# Patient Record
Sex: Female | Born: 2000 | Race: White | Hispanic: No | Marital: Single | Smoking: Never smoker
Health system: Southern US, Academic
[De-identification: ages and names within clinical notes are randomized; demographics above are authoritative.]

## PROBLEM LIST (undated history)

## (undated) DIAGNOSIS — Z973 Presence of spectacles and contact lenses: Secondary | ICD-10-CM

## (undated) DIAGNOSIS — N6489 Other specified disorders of breast: Secondary | ICD-10-CM

## (undated) DIAGNOSIS — M546 Pain in thoracic spine: Secondary | ICD-10-CM

## (undated) DIAGNOSIS — J4520 Mild intermittent asthma, uncomplicated: Secondary | ICD-10-CM

## (undated) DIAGNOSIS — E538 Deficiency of other specified B group vitamins: Secondary | ICD-10-CM

## (undated) DIAGNOSIS — J45909 Unspecified asthma, uncomplicated: Secondary | ICD-10-CM

## (undated) DIAGNOSIS — Z87898 Personal history of other specified conditions: Secondary | ICD-10-CM

## (undated) HISTORY — DX: Mild intermittent asthma, uncomplicated: J45.20

## (undated) HISTORY — DX: Other specified disorders of breast: N64.89

## (undated) HISTORY — DX: Deficiency of other specified B group vitamins: E53.8

## (undated) HISTORY — DX: Pain in thoracic spine: M54.6

## (undated) HISTORY — PX: HX TONSILLECTOMY: SHX27

## (undated) HISTORY — PX: HX WISDOM TEETH EXTRACTION: SHX21

---
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
## 2021-06-23 DIAGNOSIS — Y92410 Unspecified street and highway as the place of occurrence of the external cause: Secondary | ICD-10-CM | POA: Diagnosis not present

## 2021-06-23 DIAGNOSIS — S40811A Abrasion of right upper arm, initial encounter: Secondary | ICD-10-CM | POA: Diagnosis not present

## 2021-06-23 DIAGNOSIS — G44309 Post-traumatic headache, unspecified, not intractable: Secondary | ICD-10-CM | POA: Insufficient documentation

## 2021-06-23 DIAGNOSIS — S4991XA Unspecified injury of right shoulder and upper arm, initial encounter: Secondary | ICD-10-CM | POA: Diagnosis present

## 2021-06-24 ENCOUNTER — Emergency Department (HOSPITAL_COMMUNITY): Payer: Commercial Managed Care - PPO

## 2021-06-24 ENCOUNTER — Encounter (HOSPITAL_COMMUNITY): Payer: Self-pay | Admitting: Emergency Medicine

## 2021-06-24 ENCOUNTER — Other Ambulatory Visit: Payer: Self-pay

## 2021-06-24 ENCOUNTER — Emergency Department (HOSPITAL_COMMUNITY)
Admission: EM | Admit: 2021-06-24 | Discharge: 2021-06-24 | Disposition: A | Discharge status: Home / Self Care | Payer: Commercial Managed Care - PPO | Attending: Emergency Medicine | Admitting: Emergency Medicine

## 2021-06-24 DIAGNOSIS — G44319 Acute post-traumatic headache, not intractable: Secondary | ICD-10-CM

## 2021-06-24 MED ORDER — IBUPROFEN 800 MG PO TABS
800.0000 mg | ORAL_TABLET | Freq: Once | ORAL | Status: AC
  Administered 2021-06-24: 800 mg via ORAL
  Filled 2021-06-24: qty 1

## 2021-06-24 MED ORDER — NAPROXEN 500 MG PO TABS: 500.0000 mg | ORAL_TABLET | Freq: Two times a day (BID) | ORAL | 0 refills | Status: AC

## 2021-06-24 MED ORDER — METHOCARBAMOL 500 MG PO TABS: 500.0000 mg | ORAL_TABLET | Freq: Two times a day (BID) | ORAL | 0 refills | Status: AC
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
## 2021-06-24 NOTE — Discharge Instructions (Addendum)
1. Medications: naprosyn and robaxin for pain and muscle soreness 2. Treatment: Rest, ice on head.  Concussion precautions given - keep patient in a quiet, not simulating, dark environment. No TV, computer use, video games until headache is resolved completely. No contact sports until cleared by the pediatrician. 3. Follow Up: With primary care physician on Monday if headache persists.  Return to the emergency department if patient becomes lethargic, begins vomiting, develops double vision, speech difficulty, problems walking or other change in mental status.
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
## 2021-07-01 LAB — ENTER/EDIT EXTERNAL COMMON LAB RESULTS
CHOLESTEROL: 154
HDL-CHOLESTEROL: 48
LDL (CALCULATED): 93
LDL CHOLESTEROL,DIRECT: 13
TRIGLYCERIDES: 64

## 2022-06-18 ENCOUNTER — Encounter (INDEPENDENT_AMBULATORY_CARE_PROVIDER_SITE_OTHER): Payer: Self-pay | Admitting: Internal Medicine

## 2022-06-18 DIAGNOSIS — J4520 Mild intermittent asthma, uncomplicated: Secondary | ICD-10-CM

## 2022-06-25 ENCOUNTER — Other Ambulatory Visit: Payer: Self-pay

## 2022-06-25 ENCOUNTER — Encounter (INDEPENDENT_AMBULATORY_CARE_PROVIDER_SITE_OTHER): Payer: Self-pay | Admitting: Internal Medicine

## 2022-06-25 ENCOUNTER — Ambulatory Visit (INDEPENDENT_AMBULATORY_CARE_PROVIDER_SITE_OTHER): Payer: 59 | Admitting: Internal Medicine

## 2022-06-25 VITALS — BP 103/54 | HR 78 | Ht 61.0 in | Wt 143.4 lb

## 2022-06-25 DIAGNOSIS — R5382 Chronic fatigue, unspecified: Secondary | ICD-10-CM

## 2022-06-25 DIAGNOSIS — Z0000 Encounter for general adult medical examination without abnormal findings: Secondary | ICD-10-CM

## 2022-06-25 DIAGNOSIS — J4520 Mild intermittent asthma, uncomplicated: Secondary | ICD-10-CM

## 2022-06-25 NOTE — Progress Notes (Signed)
[facility], [facility]  [address]  [place] [address]       Name: Lindsay Bautista MRN:  [id number]   Date: 06/25/2022 Age: 21 y.o.       Chief Complaint:    Chief Complaint   Patient presents with    Annual Exam     No complaints or concerns.         HPI:  Lindsay Bautista is a 20 y.o. female who presents to the office today for follow-up.  Patient is actually doing relatively well but wishes to discuss  labs and with no other complaints.        Past Medical History:  Past Medical History:   Diagnosis Date    Asthma, mild intermittent     Pendulous breast     Thoracic back pain     Vitamin B 12 deficiency          Past Surgical History:   Procedure Laterality Date    HX WISDOM TEETH EXTRACTION        Current Outpatient Medications   Medication Sig    albuterol sulfate (PROVENTIL) 2.5 mg /3 mL (0.083 %) Inhalation nebulizer solution Take 3 mL (2.5 mg total) by nebulization Three times a day as needed for Wheezing     No Known Allergies    Family History:  Family Medical History:       Problem Relation (Age of Onset)    Hypertension (High Blood Pressure) Father    No Known Problems Brother              Social History:   Social History     Tobacco Use   Smoking Status Never   Smokeless Tobacco Never     Social History     Substance and Sexual Activity   Alcohol Use Never     Social History     Occupational History    Not on file       Review of Systems:  Review of systems as discussed in HPI    Problem List:  Patient Active Problem List   Diagnosis    Asthma, mild intermittent       Physical Examination:  BP (!) 103/54   Pulse 78   Ht 1.549 m (5\' 1" )   Wt 65 kg (143 lb 6.4 oz)   SpO2 100%   BMI 27.10 kg/m   Facility age limit for growth percentiles is 20 years.    Physical Exam  Vitals and nursing note reviewed.   Constitutional:       General: She is not in acute distress.     Appearance: Normal appearance.   HENT:      Head: Normocephalic and atraumatic.      Right Ear:  Tympanic membrane normal.      Left Ear: Tympanic membrane normal.      Nose: Nose normal.      Mouth/Throat:      Mouth: Mucous membranes are moist.      Pharynx: Oropharynx is clear.   Eyes:      General:         Right eye: No discharge.         Left eye: No discharge.      Conjunctiva/sclera: Conjunctivae normal.      Pupils: Pupils are equal, round, and reactive to light.   Cardiovascular:      Rate and Rhythm: Normal rate and regular rhythm.  Pulses:           Carotid pulses are 0 on the right side and 0 on the left side.       Popliteal pulses are 2+ on the right side and 2+ on the left side.        Dorsalis pedis pulses are 2+ on the right side and 2+ on the left side.      Heart sounds: Normal heart sounds.   Pulmonary:      Effort: Pulmonary effort is normal.      Breath sounds: Normal breath sounds. No wheezing, rhonchi or rales.   Abdominal:      General: Bowel sounds are normal. There is no distension.      Palpations: Abdomen is soft.      Tenderness: There is no abdominal tenderness. There is no rebound.   Musculoskeletal:         General: No tenderness or signs of injury. Normal range of motion.      Cervical back: Normal range of motion. No tenderness.   Lymphadenopathy:      Cervical: No cervical adenopathy.   Skin:     General: Skin is warm.      Capillary Refill: Capillary refill takes less than 2 seconds.      Coloration: Skin is not jaundiced.   Neurological:      General: No focal deficit present.      Mental Status: She is alert and oriented to person, place, and time.      Cranial Nerves: No cranial nerve deficit.      Motor: No weakness.   Psychiatric:         Behavior: Behavior normal.            Health Maintenance:  Health Maintenance   Topic Date Due    Pneumococcal Vaccine, Age 72-64 (1 - PCV) Never done    Adult Tdap-Td (6 - Tdap) 06/26/2023 (Originally 03/02/2021)    Influenza Vaccine (1) 07/31/2022    Depression Screening  06/26/2023    Annual Well Child Visit 4-21 yrs  06/26/2023     Hepatitis B Vaccine  Completed    HPV Vaccines  Completed    Meningococcal Vaccine  Completed    HIV Screening  Discontinued    Covid-19 Vaccine  Discontinued    Chlamydia/Gonorrhea Screening  Discontinued        Assessment:      ICD-10-CM    1. Annual physical exam  Z00.00 These labs ordered will be be addressed by me and I will contact patient and address therapy options.   CBC     COMPREHENSIVE METABOLIC PANEL, NON-FASTING     URINALYSIS WITH REFLEX MICROSCOPIC AND CULTURE IF POSITIVE     THYROID STIMULATING HORMONE (SENSITIVE TSH)      2. Mild intermittent asthma without complication  J45.20 Stable with use of albuterol twice yearly as needed      3. Chronic fatigue  R53.82 These labs ordered will be be addressed by me and I will contact patient and address therapy options.   CBC     COMPREHENSIVE METABOLIC PANEL, NON-FASTING     URINALYSIS WITH REFLEX MICROSCOPIC AND CULTURE IF POSITIVE     THYROID STIMULATING HORMONE (SENSITIVE TSH)             Orders Placed This Encounter    CBC    COMPREHENSIVE METABOLIC PANEL, NON-FASTING    URINALYSIS WITH REFLEX MICROSCOPIC AND CULTURE IF POSITIVE  THYROID STIMULATING HORMONE (SENSITIVE TSH)              Follow up:  Return in about 1 year (around 06/26/2023).    This note was partially created using voice recognition software and is inherently subject to errors including those of syntax and "sound alike " substitutions which may escape proof reading.  In such instances, original meaning may be extrapolated by contextual derivation.    Nettie Elm, DO  06/25/2022, 08:56

## 2022-06-26 ENCOUNTER — Other Ambulatory Visit (INDEPENDENT_AMBULATORY_CARE_PROVIDER_SITE_OTHER): Payer: Self-pay | Admitting: Internal Medicine

## 2022-06-26 DIAGNOSIS — R5382 Chronic fatigue, unspecified: Secondary | ICD-10-CM

## 2022-06-26 DIAGNOSIS — Z0000 Encounter for general adult medical examination without abnormal findings: Secondary | ICD-10-CM

## 2022-07-01 ENCOUNTER — Encounter (INDEPENDENT_AMBULATORY_CARE_PROVIDER_SITE_OTHER): Payer: Self-pay | Admitting: Internal Medicine
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
## 2023-05-31 ENCOUNTER — Ambulatory Visit (INDEPENDENT_AMBULATORY_CARE_PROVIDER_SITE_OTHER): Payer: 59 | Admitting: NURSE PRACTITIONER

## 2023-05-31 ENCOUNTER — Other Ambulatory Visit: Payer: Self-pay

## 2023-05-31 ENCOUNTER — Encounter (INDEPENDENT_AMBULATORY_CARE_PROVIDER_SITE_OTHER): Payer: Self-pay | Admitting: NURSE PRACTITIONER

## 2023-05-31 VITALS — BP 100/60 | HR 70 | Ht 60.98 in | Wt 149.8 lb

## 2023-05-31 DIAGNOSIS — Z020 Encounter for examination for admission to educational institution: Secondary | ICD-10-CM

## 2023-05-31 DIAGNOSIS — Z23 Encounter for immunization: Secondary | ICD-10-CM

## 2023-05-31 DIAGNOSIS — J4520 Mild intermittent asthma, uncomplicated: Secondary | ICD-10-CM

## 2023-05-31 MED ORDER — ALBUTEROL SULFATE 2.5 MG/3 ML (0.083 %) SOLUTION FOR NEBULIZATION
2.5000 mg | INHALATION_SOLUTION | Freq: Three times a day (TID) | RESPIRATORY_TRACT | 1 refills | Status: AC | PRN
Start: 2023-05-31 — End: ?

## 2023-05-31 NOTE — Progress Notes (Signed)
INTERNAL MEDICINE, [facility]  [address]  [place] [address]       Name: Lindsay Bautista MRN:  [id number]   Date: 05/31/2023 Age: 22 y.o.       Chief Complaint:    Chief Complaint   Patient presents with    Form Needs Completed     Pt here to get form completed for school.         HPI:  Lindsay Bautista is a 22 y.o. female who is here today for school physical for study abroad and needs updated tetanus vaccine.   She denies any current problems or complaints.          Past Medical History:  Past Medical History:   Diagnosis Date    Asthma, mild intermittent     Pendulous breast     Thoracic back pain     Vitamin B 12 deficiency          Past Surgical History:   Procedure Laterality Date    HX WISDOM TEETH EXTRACTION        Current Outpatient Medications   Medication Sig    albuterol sulfate (PROVENTIL) 2.5 mg /3 mL (0.083 %) Inhalation nebulizer solution Take 3 mL (2.5 mg total) by nebulization Three times a day as needed for Wheezing Indications: asthma attack, bronchospasm prevention     No Known Allergies    Family History:  Family Medical History:       Problem Relation (Age of Onset)    Hypertension (High Blood Pressure) Father    No Known Problems Brother              Social History:   Social History     Tobacco Use   Smoking Status Never   Smokeless Tobacco Never     Social History     Substance and Sexual Activity   Alcohol Use Never     Social History     Occupational History    Not on file       Review of Systems:  Review of systems as discussed in HPI    Problem List:  Patient Active Problem List   Diagnosis    Asthma, mild intermittent       Physical Examination:  BP 100/60   Pulse 70   Ht 1.549 m (5' 0.98")   Wt 67.9 kg (149 lb 12.8 oz)   SpO2 98%   BMI 28.32 kg/m       Physical Exam  Vitals and nursing note reviewed.   Constitutional:       General: She is not in acute distress.     Appearance: Normal appearance. She is overweight. She is not ill-appearing.   HENT:       Head: Normocephalic.      Right Ear: Tympanic membrane normal.      Left Ear: Tympanic membrane normal.      Mouth/Throat:      Mouth: Mucous membranes are moist.      Pharynx: Oropharynx is clear.   Eyes:      Extraocular Movements: Extraocular movements intact.      Pupils: Pupils are equal, round, and reactive to light.   Cardiovascular:      Rate and Rhythm: Normal rate.      Pulses: Normal pulses.      Heart sounds: Normal heart sounds, S1 normal and S2 normal.   Pulmonary:      Effort: Pulmonary effort is normal.  Breath sounds: Normal breath sounds.   Abdominal:      General: Abdomen is flat. Bowel sounds are normal.      Palpations: Abdomen is soft.   Lymphadenopathy:      Cervical: No cervical adenopathy.   Skin:     General: Skin is warm and dry.      Capillary Refill: Capillary refill takes less than 2 seconds.   Neurological:      General: No focal deficit present.      Mental Status: She is alert and oriented to person, place, and time.        Data Reviewed:  No visits with results within 6 Month(s) from this visit.   Latest known visit with results is:   Documentation Only on 06/18/2022   Component Date Value Ref Range Status    CHOLESTEROL 06/30/2021 154   Final    HDL-CHOLESTEROL 06/30/2021 48   Final    LDL (CALCULATED) 06/30/2021 93   Final    TRIGLYCERIDES 06/30/2021 64   Final    LDL CHOLESTEROL,DIRECT 06/30/2021 13   Final        Health Maintenance:  Health Maintenance   Topic Date Due    Hepatitis C screening  Never done    Pneumococcal Vaccine, Age 61-64 (1 of 2 - PCV) Never done    Pap smear/HPV  Never done    Adult Tdap-Td (6 - Tdap) 06/26/2023 (Originally 03/02/2021)    NonMedicare Preventative Exam  06/26/2023    Influenza Vaccine (1) 08/01/2023    Depression Screening  05/30/2024    Hepatitis B Vaccine  Completed    HPV Vaccines  Completed    Meningococcal Vaccine  Completed    HIV Screening  Discontinued    Covid-19 Vaccine  Discontinued    Chlamydia/Gonorrhea Screening   Discontinued        Assessment & Plan   Problem List Items Addressed This Visit          Respiratory    Asthma, mild intermittent    Relevant Medications    albuterol sulfate (PROVENTIL) 2.5 mg /3 mL (0.083 %) Inhalation nebulizer solution     Other Visit Diagnoses       Encounter for school health examination    -  Primary    Relevant Orders    Tdap Vaccine - Boostrix - >=10 yrs    Encounter for administration of vaccine        Relevant Orders    Tdap Vaccine - Boostrix - >=10 yrs             Depression screening is negative. PHQ 2 Total: 0     See copy of Study Abroad form scanned into chart. Patient physically cleared for her study abroad trip.   TDAP administered in office, patient tolerated well without compliant.        Follow up:  Return if symptoms worsen or fail to improve. Keep f/u as scheduled with Dr. Katrinka Blazing.     This note was partially created using voice recognition software and is inherently subject to errors including those of syntax and "sound alike " substitutions which may escape proof reading.  In such instances, original meaning may be extrapolated by contextual derivation.    Macario Golds, FNP-C  05/31/2023, 15:03

## 2023-06-28 ENCOUNTER — Ambulatory Visit (INDEPENDENT_AMBULATORY_CARE_PROVIDER_SITE_OTHER): Payer: Self-pay | Admitting: Internal Medicine

## 2023-12-07 ENCOUNTER — Ambulatory Visit (INDEPENDENT_AMBULATORY_CARE_PROVIDER_SITE_OTHER): Payer: 59 | Admitting: Internal Medicine

## 2023-12-09 ENCOUNTER — Other Ambulatory Visit: Payer: Self-pay

## 2023-12-09 ENCOUNTER — Ambulatory Visit (INDEPENDENT_AMBULATORY_CARE_PROVIDER_SITE_OTHER): Payer: 59 | Admitting: Internal Medicine

## 2023-12-09 ENCOUNTER — Encounter (INDEPENDENT_AMBULATORY_CARE_PROVIDER_SITE_OTHER): Payer: Self-pay | Admitting: Internal Medicine

## 2023-12-09 VITALS — BP 96/68 | HR 102 | Ht 61.0 in | Wt 144.2 lb

## 2023-12-09 DIAGNOSIS — J4520 Mild intermittent asthma, uncomplicated: Secondary | ICD-10-CM

## 2023-12-09 DIAGNOSIS — Z0000 Encounter for general adult medical examination without abnormal findings: Secondary | ICD-10-CM

## 2023-12-09 DIAGNOSIS — R5382 Chronic fatigue, unspecified: Secondary | ICD-10-CM

## 2023-12-09 DIAGNOSIS — E538 Deficiency of other specified B group vitamins: Secondary | ICD-10-CM

## 2023-12-09 DIAGNOSIS — Z13220 Encounter for screening for lipoid disorders: Secondary | ICD-10-CM

## 2023-12-09 NOTE — Progress Notes (Signed)
INTERNAL MEDICINE, [facility]  [address]  [place] [address]       Name: Lindsay Bautista MRN:  [id number]   Date: 12/09/2023 Age: 23 y.o.       OUTPATIENT PROGRESS NOTE    Subjective:   Patient ID:  Lindsay Bautista is a pleasant 23 y.o. female.    Chief Complaint: Annual Exam (Has been having a lot of back pain and would like to discuss getting breast reduction. )      History of Present Illness:  Pt presents for a well adult exam.  The Comprehensive Health Assessment was completed by the patient and reviewed with them.    Diet:  well balanced  Activity level: active lifestyle  Sleep: Adequate sleep  Compliant with taking medications: No    Patient Active Problem List   Diagnosis    Asthma, mild intermittent       Acute issues:  no complaints       The history is provided by the patient.      Allergies:   No Known Allergies      Medications:   albuterol sulfate (PROVENTIL) 2.5 mg /3 mL (0.083 %) Inhalation nebulizer solution, Take 3 mL (2.5 mg total) by nebulization Three times a day as needed for Wheezing Indications: asthma attack, bronchospasm prevention    No facility-administered medications prior to visit.        Immunization History:     Immunization History   Administered Date(s) Administered    Covid-19 Vaccine,Moderna,12 Years+ 02/23/2020, 03/22/2020, 11/27/2020    Diptheria/Tetanus/Pertussis 6wk to <94yrs 05/25/2002, 08/04/2002, 02/13/2003, 01/14/2004, 01/24/2004, 07/15/2007    H1n1 Flu Vaccine Injection (Admin) 10/17/2008, 12/14/2008    HAEMOPHILUS B CONJUGATE VACCINE 01/24/2004    HPV VACCINE 06/12/2014, 12/03/2014    Hep B, Adolescent Or Pediatric 05/25/2002, 08/04/2002, 02/13/2003    Influenza Vaccine, 6 month-adult 09/11/2004, 10/17/2004, 09/29/2005, 09/22/2006, 10/18/2008, 12/30/2016    MEASLES/MUMPS/RUBELLA VIRUS VACCINE 05/17/2003, 01/24/2004, 07/15/2007    Meningococcal Vaccine 05/25/2019    POLIOMYELITIS VACCINE 05/25/2002, 08/04/2002, 02/13/2003, 01/14/2004,  07/15/2007    Research: UTC: Measles, Mumps, Rubella (MMRII) Vaccine 05/17/2003, 07/15/2007    Tetanus Toxoid/Diphtheria Toxoid/Acellular Pertussis Vaccine, Adsorbed 05/31/2023    VARICELLA VACCINE LIVE 01/24/2004, 07/15/2007         Past Medical History:     Past Medical History:   Diagnosis Date    Asthma, mild intermittent     Pendulous breast     Thoracic back pain     Vitamin B 12 deficiency              Past Surgical History:     Past Surgical History:   Procedure Laterality Date    HX WISDOM TEETH EXTRACTION               Family History:     Family Medical History:       Problem Relation (Age of Onset)    Hypertension (High Blood Pressure) Father    No Known Problems Brother                Social History:   Wonder Laveck  reports that she has never smoked. She has never used smokeless tobacco. She reports that she does not drink alcohol and does not use drugs.        Review of Systems:     Constitutional   Denies: Fatigue, Fever  Eyes   Denies: Ocular Pain/Tenderness, Changes in Vision  HENT  Denies: Nasal Congestion Hoarsness, Headaches, Vertigo/Dizziness  Cardiovascular:   Denies: Chest Pain, Dyspnea on Exertion, Lower Extremity Edema  Respiratory:   Denies: Shortness of Breath, Cough  Gastrointestinal:   Denies: Reflux, Diarrhea, Constipation, Abdominal Pain  Genitourinary:   Denies: Urgency, Frequency, Dysuria  Integument:   Denies: Rash  Neurologic:   Denies: Muscular Weakness, Memory Difficulties  Musculoskeletal:   Denies: Joint Pain  Endocrine:   Denies: Cold Intolerance, Heat Intolerance  Psychiatric:   Denies: Anxiety, Depression, Difficulty Sleeping  Heme-Lymph:   Denies: Easy Bruising, Lymph Node Enlargement or Tenderness      Objective:   Vitals:    Vitals:    12/09/23 1106   BP: 96/68   Pulse: (!) 102   SpO2: 98%   Weight: 65.4 kg (144 lb 3.2 oz)   Height: 1.549 m (5\' 1" )   BMI: 27.25          Body mass index is 27.25 kg/m.      Constitutional: Alert, well developed, well  nourished  HEENT:  Head: NC/AT    Eyes: Sclera anicteric, conjunctiva not injected    Ears: EAC normal, bilateral TMs clear    Nose: No discharge    Throat: MMM, posterior pharynx without erythema or exudate  Neck:   Supple with normal ROM, no cervical LAD, no thyromegaly, no JVD, no carotid bruits  Cardiovascular: RRR, normal S1/S2, no murmurs/rubs/gallops  Pulmonary:  CTAB, equal air entry, nonlabored, no wheezes/crackles/rhonchi  Abdomen:   NABS, NT/ND, soft, no HSM, no masses  Musculoskeletal:  No deformity, no injury, no edema  Neurological:   Alert, oriented x 3, no abnormal tone  Skin:     Warm, pink, dry, no rashes, no jaundice, no pallor, no cyanosis  Psychiatric:  Normal mood, affect, behavior, judgment, and thought content      Labs:     No visits with results within 6 Month(s) from this visit.   Latest known visit with results is:   Documentation Only on 06/18/2022   Component Date Value Ref Range Status    CHOLESTEROL 06/30/2021 154   Final    HDL-CHOLESTEROL 06/30/2021 48   Final    LDL (CALCULATED) 06/30/2021 93   Final    TRIGLYCERIDES 06/30/2021 64   Final    LDL CHOLESTEROL,DIRECT 06/30/2021 13   Final       Assessment & Plan:       ICD-10-CM    1. Annual physical exam  Z00.00 Performed    CBC     URINALYSIS, MACROSCOPIC AND MICROSCOPIC W/CULTURE REFLEX     COMPREHENSIVE METABOLIC PANEL, NON-FASTING      2. Mild intermittent asthma without complication  J45.20 Condition stable will continue current therapy.        3. Lipid screening  Z13.220 Labs ordered here and on this visit will be be addressed by me. I will contact patient and address therapy options once completed.     LIPID PANEL      4. Vitamin B12 deficiency  E53.8 Labs ordered here and on this visit will be be addressed by me. I will contact patient and address therapy options once completed.     VITAMIN B12      5. Chronic fatigue  R53.82 Labs ordered here and on this visit will be be addressed by me. I will contact patient and address  therapy options once completed.     THYROID STIMULATING HORMONE WITH FREE T4 REFLEX        Orders  Placed This Encounter    CBC    LIPID PANEL    VITAMIN B12    URINALYSIS, MACROSCOPIC AND MICROSCOPIC W/CULTURE REFLEX    COMPREHENSIVE METABOLIC PANEL, NON-FASTING    THYROID STIMULATING HORMONE WITH FREE T4 REFLEX     There are no discontinued medications.       Health Maintenance   Topic Date Due    Influenza Vaccine (1) 01/08/2024 (Originally 08/01/2023)    Pneumococcal Vaccine, Age 81-49 (1 of 2 - PCV) 12/08/2024 (Originally 03/02/2021)    Pap smear/HPV  03/02/2024    Depression Screening  05/30/2024    NonMedicare Preventative Exam  12/08/2024    Adult Tdap-Td (7 - Td or Tdap) 05/30/2033    Hepatitis B Vaccine  Completed    HPV Vaccines  Completed    Hepatitis C screening  Discontinued    HIV Screening  Discontinued    Covid-19 Vaccine  Discontinued    Chlamydia/Gonorrhea Screening  Discontinued         Return in about 1 year (around 12/08/2024).    The patient has been educated and verbalized understanding regarding the services provided during this visit.    Lucia Gaskins, DO  12/09/2023 11:18

## 2024-05-03 ENCOUNTER — Encounter (HOSPITAL_BASED_OUTPATIENT_CLINIC_OR_DEPARTMENT_OTHER): Payer: Self-pay | Admitting: Plastic and Reconstructive Surgery

## 2024-05-03 NOTE — OR PreOp (Signed)
 [facility]   Pre-Admission Testing Nurse: [phone]  Surgery Date: 05/09/2024  Arrive at: ONE DAY SURGERY  Procedure: REDUCTION BREAST BILATERAL: 09811 (CPT)    Bring a complete and accurate list of your medications (including dosages) AND past medical and surgical history. This helps to ensure you received the safest care possible.   If you are taking any over-the-counter substances, such as herbal supplements, diet pills, pain relievers or other non prescription medication, please notify the Pre-Admission Testing Nurse, your doctor and any other health team member interviewing you or performing any assessment. DO NOT take any over-the-counter medications for 8 days prior to your procedure, unless otherwise instructed by your physician. Failure to disclose this information could be life threatening.   DO NOT eat or drink anything after midnight prior to surgery including: NO GUM, NO MINTS, NO TOBACCO OR TOBACCO PRODUCTS OF ANY KIND - 24 HOURS PRIOR TO SURGERY. (including but not limited to: cigarettes, cigars, chewing tobacco, snuff, pipe, vaping etc.), Approved medications may be taken with a sip of water the morning of your surgery. You may brush your teeth the morning of your surgery.   Do not wear make-up, lotions, or colored fingernail polish the day of surgery.   Leave all jewelry (including piercing) at home due to the risk of injury. No jewelry will be permitted in the Operating Room. ALL piercings must be removed, metal, plastic, silicone, etc.   If you have any prosthetics, please inform your pre-admission testing nurse.   Wear loose-fitting, comfortable clothing.   If you require reading glasses or hearing aids, please bring them with you the day of surgery. Your dentures can remain in place until immediately before your procedure, but do not use any denture adhesive. They will be returned once you are awake and alert in the Recovery Room.   If you require interpretive services,  our facility will provide these for you. Please ask if you have any questions regarding this service.   If required for your procedure, you will be given a Hibiclens Soap Kit with instructions for use at home. The kit is to be used the night before AND the morning of surgery following the instructions given.   When you arrive at the area specified, the nurse will prepare you for your procedure and complete any additional blood work/paperwork. An IV will be started as needed.   One support person/family member will be able to join you in the pre-operative area once the per-op nurse has prepared you for your procedure. Children may have both parents with them.   When you are taken to the Operating Room, your family will be shown to the waiting room. Your family will be provided with a case number so they can track the progress of your surgical case while in the waiting area. When your procedure has been completed, your surgeon will speak to your family.   After your procedure, you will be taken to the Recovery Room until you are awake and alert (30 minutes - 2 hours). You will then be transported to the appropriate area for further care.   You can expect to experience some discomfort and possible nausea. Discuss this with your nurse so that medications can be given, if appropriate.   Some procedures may require you to assume a special position in the recovery period. We will explain this if it applies to you.   It may also be necessary for you to have tubes after surgery (catheter, drains,  from incision).   After surgery, you will need to breathe deeply and to cough (helping to prevent a form of pneumonia that can occur after having general anesthesia).   Medications to be taken the morning of surgery with a sip of water: NONE   Last dose of Metformin 48 hours prior to surgery: NA   Additional comments: NO OTC MEDS 8 DAYS PRIOR TO SURGERY   If you use CPAP/BiPAP, bring your machine with you on day of  surgery if you will be staying overnight.  For those patients having same-day surgery:   Arrange for transportation home with a responsible adult who will remain with you for 24 hours after your procedure. The adult must be present with you prior to the procedure. You will not be allowed to proceed with the planned surgery if you are not accompanied by an appropriate post-procedure caregiver.   When you are able to tolerate fluids, urinate, etc, you will receive discharge instructions for your care at home (both verbal and written).   After completing all of the above, you will be discharged to home with instructions to follow-up   If you have any additional questions, please contact your surgeon's office or the Pre-Admission Testing Nurse at [phone].   Please follow any specific instructions from you surgeon's office concerning aspirin/ other blood thinners, physical therapy, or bowel preps/enemas, etc.PER SURGEON    SIGNATURE: ___________________________________________________.    DATE:  ___________________________________________________.

## 2024-05-03 NOTE — OR PreOp (Addendum)
 NO CHEST PAIN, DENIES CARDIOLOGIST    DENIES RECENT DENTAL WORK    PATIENT DOES NOT MEET CRITERIA FOR TESTING

## 2024-05-05 ENCOUNTER — Ambulatory Visit (HOSPITAL_COMMUNITY): Payer: Self-pay

## 2024-05-09 ENCOUNTER — Encounter (HOSPITAL_BASED_OUTPATIENT_CLINIC_OR_DEPARTMENT_OTHER): Payer: Self-pay | Admitting: Anesthesiology

## 2024-05-09 ENCOUNTER — Encounter (HOSPITAL_BASED_OUTPATIENT_CLINIC_OR_DEPARTMENT_OTHER)
Admission: RE | Disposition: A | Discharge status: Home / Self Care | Payer: Self-pay | Source: Ambulatory Visit | Attending: Plastic and Reconstructive Surgery

## 2024-05-09 ENCOUNTER — Ambulatory Visit
Admission: RE | Admit: 2024-05-09 | Discharge: 2024-05-09 | Disposition: A | Discharge status: Home / Self Care | Payer: Self-pay | Source: Ambulatory Visit | Attending: Plastic and Reconstructive Surgery | Admitting: Plastic and Reconstructive Surgery

## 2024-05-09 ENCOUNTER — Encounter (HOSPITAL_BASED_OUTPATIENT_CLINIC_OR_DEPARTMENT_OTHER): Payer: Self-pay | Admitting: Plastic and Reconstructive Surgery

## 2024-05-09 ENCOUNTER — Ambulatory Visit (HOSPITAL_BASED_OUTPATIENT_CLINIC_OR_DEPARTMENT_OTHER): Payer: Self-pay | Admitting: Plastic and Reconstructive Surgery

## 2024-05-09 ENCOUNTER — Other Ambulatory Visit: Payer: Self-pay

## 2024-05-09 DIAGNOSIS — J45909 Unspecified asthma, uncomplicated: Secondary | ICD-10-CM | POA: Insufficient documentation

## 2024-05-09 DIAGNOSIS — N62 Hypertrophy of breast: Secondary | ICD-10-CM | POA: Insufficient documentation

## 2024-05-09 DIAGNOSIS — D649 Anemia, unspecified: Secondary | ICD-10-CM | POA: Insufficient documentation

## 2024-05-09 HISTORY — DX: Unspecified asthma, uncomplicated: J45.909

## 2024-05-09 HISTORY — DX: Presence of spectacles and contact lenses: Z97.3

## 2024-05-09 HISTORY — DX: Personal history of other specified conditions: Z87.898

## 2024-05-09 SURGERY — REDUCTION BREAST BILATERAL
Anesthesia: General | Laterality: Bilateral | Wound class: Clean Wound: Uninfected operative wounds in which no inflammation occurred

## 2024-05-09 MED ORDER — LIDOCAINE HCL 10 MG/ML (1 %) INJECTION SOLUTION
Freq: Once | INTRAMUSCULAR | Status: DC | PRN
Start: 2024-05-09 — End: 2024-05-09
  Administered 2024-05-09: 200 mL via INTRAMUSCULAR

## 2024-05-09 MED ORDER — ALBUTEROL SULFATE 2.5 MG/3 ML (0.083 %) SOLUTION FOR NEBULIZATION
2.5000 mg | INHALATION_SOLUTION | Freq: Once | RESPIRATORY_TRACT | Status: DC | PRN
Start: 2024-05-09 — End: 2024-05-09

## 2024-05-09 MED ORDER — ACETAMINOPHEN 1,000 MG/100 ML (10 MG/ML) INTRAVENOUS SOLUTION
INTRAVENOUS | Status: AC
Start: 2024-05-09 — End: 2024-05-09
  Filled 2024-05-09: qty 100

## 2024-05-09 MED ORDER — ONDANSETRON HCL (PF) 4 MG/2 ML INJECTION SOLUTION
INTRAMUSCULAR | Status: AC
Start: 2024-05-09 — End: 2024-05-09
  Filled 2024-05-09: qty 2

## 2024-05-09 MED ORDER — MIDAZOLAM 1 MG/ML INJECTION WRAPPER
Freq: Once | INTRAMUSCULAR | Status: DC | PRN
Start: 2024-05-09 — End: 2024-05-09
  Administered 2024-05-09: 2 mg via INTRAVENOUS

## 2024-05-09 MED ORDER — HYDROMORPHONE 2 MG/ML INJECTION WRAPPER
INJECTION | INTRAMUSCULAR | Status: AC
Start: 2024-05-09 — End: 2024-05-09
  Filled 2024-05-09: qty 1

## 2024-05-09 MED ORDER — MAGNESIUM SULFATE 2 GRAM/50 ML (4 %) IN WATER INTRAVENOUS PIGGYBACK
INJECTION | Freq: Once | INTRAVENOUS | Status: DC | PRN
Start: 2024-05-09 — End: 2024-05-09
  Administered 2024-05-09: 2 g via INTRAVENOUS

## 2024-05-09 MED ORDER — FENTANYL (PF) 50 MCG/ML INJECTION WRAPPER
25.0000 ug | INJECTION | INTRAMUSCULAR | Status: DC | PRN
Start: 2024-05-09 — End: 2024-05-09

## 2024-05-09 MED ORDER — ACETAMINOPHEN 1,000 MG/100 ML (10 MG/ML) INTRAVENOUS SOLUTION
Freq: Once | INTRAVENOUS | Status: DC | PRN
Start: 2024-05-09 — End: 2024-05-09
  Administered 2024-05-09: 1000 mg via INTRAVENOUS

## 2024-05-09 MED ORDER — DEXMEDETOMIDINE 100 MCG/ML INTRAVENOUS SOLUTION
Freq: Once | INTRAVENOUS | Status: DC | PRN
Start: 2024-05-09 — End: 2024-05-09
  Administered 2024-05-09: 8 ug via INTRAVENOUS

## 2024-05-09 MED ORDER — FAMOTIDINE (PF) 20 MG/50 ML IN 0.9 % NACL (ISO) INTRAVENOUS PIGGYBACK
INJECTION | Freq: Once | INTRAVENOUS | Status: DC | PRN
Start: 2024-05-09 — End: 2024-05-09
  Administered 2024-05-09 (×2): 20 mg via INTRAVENOUS

## 2024-05-09 MED ORDER — AMISULPRIDE 5 MG/2 ML (2.5 MG/ML) INTRAVENOUS SOLUTION
INTRAVENOUS | Status: AC
Start: 2024-05-09 — End: 2024-05-09
  Filled 2024-05-09: qty 4

## 2024-05-09 MED ORDER — APREPITANT 40 MG CAPSULE
40.0000 mg | ORAL_CAPSULE | Freq: Once | ORAL | Status: DC
Start: 2024-05-09 — End: 2024-05-09

## 2024-05-09 MED ORDER — MIDAZOLAM 1 MG/ML INJECTION WRAPPER
INTRAMUSCULAR | Status: AC
Start: 2024-05-09 — End: 2024-05-09
  Filled 2024-05-09: qty 4

## 2024-05-09 MED ORDER — ACETAMINOPHEN 1,000 MG/100 ML (10 MG/ML) INTRAVENOUS SOLUTION
1000.0000 mg | Freq: Once | INTRAVENOUS | Status: AC
Start: 2024-05-09 — End: 2024-05-09
  Administered 2024-05-09: 0 mg via INTRAVENOUS
  Administered 2024-05-09: 1000 mg via INTRAVENOUS

## 2024-05-09 MED ORDER — FENTANYL (PF) 50 MCG/ML INJECTION WRAPPER
50.0000 ug | INJECTION | Freq: Once | INTRAMUSCULAR | Status: DC
Start: 2024-05-09 — End: 2024-05-09

## 2024-05-09 MED ORDER — REMIFENTANIL 50 MCG/ML INFUSION - FOR ANES
INTRAVENOUS | Status: DC | PRN
Start: 2024-05-09 — End: 2024-05-09
  Administered 2024-05-09 (×3): .15 ug/kg/min via INTRAVENOUS
  Administered 2024-05-09: 0 ug/kg/min via INTRAVENOUS

## 2024-05-09 MED ORDER — MAGNESIUM SULFATE 2 GRAM/50 ML (4 %) IN WATER INTRAVENOUS PIGGYBACK
INJECTION | INTRAVENOUS | Status: AC
Start: 2024-05-09 — End: 2024-05-09
  Filled 2024-05-09: qty 50

## 2024-05-09 MED ORDER — LACTATED RINGERS INTRAVENOUS SOLUTION
INTRAVENOUS | Status: DC
Start: 2024-05-09 — End: 2024-05-09
  Administered 2024-05-09: 0 via INTRAVENOUS

## 2024-05-09 MED ORDER — ONDANSETRON HCL (PF) 4 MG/2 ML INJECTION SOLUTION
4.0000 mg | Freq: Once | INTRAMUSCULAR | Status: DC
Start: 2024-05-09 — End: 2024-05-09

## 2024-05-09 MED ORDER — BUPIVACAINE-EPINEPHRINE (PF) 0.5 %-1:200,000 INJECTION SOLUTION
Freq: Once | INTRAMUSCULAR | Status: DC | PRN
Start: 2024-05-09 — End: 2024-05-09
  Administered 2024-05-09: 130 mL via INTRAMUSCULAR

## 2024-05-09 MED ORDER — AMISULPRIDE 5 MG/2 ML (2.5 MG/ML) INTRAVENOUS SOLUTION
10.0000 mg | Freq: Once | INTRAVENOUS | Status: AC
Start: 2024-05-09 — End: 2024-05-09
  Administered 2024-05-09: 10 mg via INTRAVENOUS

## 2024-05-09 MED ORDER — FENTANYL (PF) 50 MCG/ML INJECTION SOLUTION
INTRAMUSCULAR | Status: AC
Start: 2024-05-09 — End: 2024-05-09
  Filled 2024-05-09: qty 20

## 2024-05-09 MED ORDER — DEXAMETHASONE SODIUM PHOSPHATE 4 MG/ML INJECTION SOLUTION
Freq: Once | INTRAMUSCULAR | Status: DC | PRN
Start: 2024-05-09 — End: 2024-05-09
  Administered 2024-05-09: 8 mg via INTRAVENOUS
  Administered 2024-05-09: 4 mg via INTRAVENOUS

## 2024-05-09 MED ORDER — CEFAZOLIN 2 GRAM/50 ML IN DEXTROSE (ISO-OSMOTIC) INTRAVENOUS PIGGYBACK
INJECTION | INTRAVENOUS | Status: AC
Start: 2024-05-09 — End: 2024-05-09
  Filled 2024-05-09: qty 50

## 2024-05-09 MED ORDER — PROPOFOL 10 MG/ML IV BOLUS
INJECTION | Freq: Once | INTRAVENOUS | Status: DC | PRN
Start: 2024-05-09 — End: 2024-05-09
  Administered 2024-05-09: 150 mg via INTRAVENOUS

## 2024-05-09 MED ORDER — ONDANSETRON HCL (PF) 4 MG/2 ML INJECTION SOLUTION
4.0000 mg | Freq: Once | INTRAMUSCULAR | Status: DC | PRN
Start: 2024-05-09 — End: 2024-05-09
  Administered 2024-05-09: 4 mg via INTRAVENOUS

## 2024-05-09 MED ORDER — IPRATROPIUM 0.5 MG-ALBUTEROL 3 MG (2.5 MG BASE)/3 ML NEBULIZATION SOLN
3.0000 mL | INHALATION_SOLUTION | Freq: Once | RESPIRATORY_TRACT | Status: DC | PRN
Start: 2024-05-09 — End: 2024-05-09

## 2024-05-09 MED ORDER — FAMOTIDINE (PF) 20 MG/2 ML INTRAVENOUS SOLUTION
Freq: Once | INTRAVENOUS | Status: DC | PRN
Start: 2024-05-09 — End: 2024-05-09
  Administered 2024-05-09: 20 mg via INTRAVENOUS

## 2024-05-09 MED ORDER — ONDANSETRON HCL (PF) 4 MG/2 ML INJECTION SOLUTION
Freq: Once | INTRAMUSCULAR | Status: DC | PRN
Start: 2024-05-09 — End: 2024-05-09
  Administered 2024-05-09 (×2): 4 mg via INTRAVENOUS

## 2024-05-09 MED ORDER — ROCURONIUM 10 MG/ML INTRAVENOUS SOLUTION
Freq: Once | INTRAVENOUS | Status: DC | PRN
Start: 2024-05-09 — End: 2024-05-09
  Administered 2024-05-09: 5 mg via INTRAVENOUS
  Administered 2024-05-09: 50 mg via INTRAVENOUS

## 2024-05-09 MED ORDER — SODIUM CHLORIDE 0.9 % INTRAVENOUS SOLUTION
Freq: Once | INTRAVENOUS | Status: DC
  Filled 2024-05-09: qty 1

## 2024-05-09 MED ORDER — PROCHLORPERAZINE EDISYLATE 10 MG/2 ML (5 MG/ML) INJECTION SOLUTION
5.0000 mg | Freq: Once | INTRAMUSCULAR | Status: DC | PRN
Start: 2024-05-09 — End: 2024-05-09

## 2024-05-09 MED ORDER — SUCCINYLCHOLINE 20 MG/ML INTRAVENOUS WRAPPER
INJECTION | Freq: Once | INTRAVENOUS | Status: DC | PRN
Start: 2024-05-09 — End: 2024-05-09
  Administered 2024-05-09: 160 mg via INTRAVENOUS

## 2024-05-09 MED ORDER — LIDOCAINE HCL 20 MG/ML (2 %) INJECTION SOLUTION
Freq: Once | INTRAMUSCULAR | Status: DC | PRN
Start: 2024-05-09 — End: 2024-05-09
  Administered 2024-05-09: 60 mg via INTRAVENOUS

## 2024-05-09 MED ORDER — SUGAMMADEX 100 MG/ML INTRAVENOUS SOLUTION
Freq: Once | INTRAVENOUS | Status: DC | PRN
Start: 2024-05-09 — End: 2024-05-09
  Administered 2024-05-09: 200 mg via INTRAVENOUS

## 2024-05-09 MED ORDER — FENTANYL (PF) 50 MCG/ML INJECTION WRAPPER
INJECTION | Freq: Once | INTRAMUSCULAR | Status: DC | PRN
Start: 2024-05-09 — End: 2024-05-09
  Administered 2024-05-09 (×10): 100 ug via INTRAVENOUS

## 2024-05-09 MED ORDER — SODIUM CHLORIDE 0.9 % INTRAVENOUS SOLUTION
Freq: Once | INTRAVENOUS | Status: DC
  Filled 2024-05-09: qty 30

## 2024-05-09 SURGICAL SUPPLY — 16 items
APPL 70% ISPRP 2% CHG 26ML CHLRPRP HI-LT ORNG PREP STRL LF  DISP CLR (MED SURG SUPPLIES) ×3 IMPLANT
DRAPE 66X44IN POLYUR WRMR ORS ORS FLUID WARM SYS STRL DISP (DRAPE/PACKS/SHEETS/OR TOWEL) ×1 IMPLANT
ELECTRODE ESURG BLADE 6.5IN 3/32IN EDGE STRL .2IN DISP INSL STD SHAFT XTD LF (SURGICAL CUTTING SUPPLIES) ×1 IMPLANT
ELECTRODE PATIENT RTN 9FT VLAB C30- LB RM PHSV ACRL FOAM CORD NONIRRITATE NONSENSITIZE ADH STRP (SURGICAL CUTTING SUPPLIES) ×2 IMPLANT
GLOVE SURG 8 LF  PF BEAD CUF STRL CRM 11.8IN PROTEXIS PI PLISPRN THK9.1 MIL (GLOVES AND ACCESSORIES) ×2 IMPLANT
GOWN SURG XL L2 NONREINFORCE HKLP CLSR LTWT STRL LF  DISP BLU ECLIPSE SMS 47IN (DRAPE/PACKS/SHEETS/OR TOWEL) ×1 IMPLANT
MAT MAG 20X16IN INSTR HLDR (DRAPE/PACKS/SHEETS/OR TOWEL) ×1 IMPLANT
PACK SURG PROC MDCTD GEN STRL LF DISP UNIV PLST (CUSTOM TRAYS & PACK) ×1 IMPLANT
SMOKE PENCIL WITH EDGE ELECTRODE 15 FT (MED SURG SUPPLIES) ×2 IMPLANT
SPONGE LAP 18X18IN STRL (MED SURG SUPPLIES) ×4 IMPLANT
STAPLER SKIN 4.1X6.5MM 35 W STPL CART LF  APS U DISP CLR SS PLASTIC (WOUND CARE SUPPLY) ×1 IMPLANT
SUTURE 0 CT1 CR VICRYL+ 27IN UNDYED BRD ANBCTRL COAT ABS (SUTURE/WOUND CLOSURE) ×4 IMPLANT
SUTURE 2-0 VICRYL+ LIGAPK 54IN UNDYED BRD REEL ANBCTRL COAT ABS (SUTURE/WOUND CLOSURE) ×1 IMPLANT
TOWEL 26X16IN COTTON BLU SAF DISP SURG STRL LF (DRAPE/PACKS/SHEETS/OR TOWEL) ×5 IMPLANT
TUBING INFLTR 170IN Y SIL SLEEVE 2 SPIKE (MED SURG SUPPLIES) IMPLANT
TUBING SUCT 12FT PSITC STRL LF  DISP (MED SURG SUPPLIES) IMPLANT

## 2024-05-09 NOTE — Anesthesia Postprocedure Evaluation (Addendum)
 Anesthesia Post Op Evaluation    Patient: [facility]  Procedure(s) with comments:  REDUCTION BREAST BILATERAL - LIPO MACHINE,PATEINT BRING GARMENTS,SUPINE    Last Vitals:Temperature: 36.7 C (98 F) (05/09/24 1020)  Heart Rate: 66 (05/09/24 1211)  BP (Non-Invasive): (!) 89/54 (05/09/24 1211)  Respiratory Rate: 16 (05/09/24 1211)  SpO2: 97 % (05/09/24 1211)    No notable events documented.    Patient is sufficiently recovered from the effects of anesthesia to participate in the evaluation and has returned to their pre-procedure level.  Patient location during evaluation: PACU       Patient participation: complete - patient participated  Level of consciousness: awake and alert and responsive to verbal stimuli  Multimodal Pain Management: Multimodal analgesia used between 6 hours prior to anesthesia start to PACU discharge  Pain management: adequate  Airway patency: patent    Anesthetic complications: no  Cardiovascular status: acceptable  Respiratory status: acceptable, spontaneous ventilation and room air  Hydration status: acceptable  Patient post-procedure temperature: Pt Normothermic   PONV Status: Absent, Improved and Treated  Comments: Barhemsys rescue needed with improvement  In Phase II with mother    Note* Pt did NOT receive Emend due to difficulty in taking pills

## 2024-05-09 NOTE — OR Nursing (Signed)
 Left breast tissue removed   613.7 gram  Right breast tissue removed 726.7 gram

## 2024-05-09 NOTE — Discharge Instructions (Addendum)
 SURGICAL DISCHARGE INSTRUCTIONS     Dr. Duard Getting, MD  performed your REDUCTION BREAST BILATERAL today at the [facility]:  Monday through Friday from 8 a.m. - 4 p.m.: [phone]   Between 4 p.m. - 8 a.m., weekends and holidays:  Call ER [phone]        SIGNS AND SYMPTOMS OF A WOUND / INCISION INFECTION   Be sure to watch for the following:  Increase in redness or red streaks near or around the wound or incision.  Increase in pain that is intense or severe and cannot be relieved by the pain medication that your doctor has given you.  Increase in swelling that cannot be relieved by elevation of a body part, or by applying ice, if permitted.  Increase in drainage, or if yellow / green in color and smells bad. This could be on a dressing or a cast.  Increase in fever for longer than 24 hours, or an increase that is higher than 101 degrees Fahrenheit (normal body temperature is 98 degrees Fahrenheit). The incision may feel warm to the touch.    **CALL YOUR DOCTOR IF ONE OR MORE OF THESE SIGNS / SYMPTOMS SHOULD OCCUR.    ANESTHESIA INFORMATION   ANESTHESIA -- ADULT PATIENTS:  You have received intravenous sedation / general anesthesia, and you may feel drowsy and light-headed for several hours. You may even experience some forgetfulness of the procedure. DO NOT DRIVE A MOTOR VEHICLE or perform any activity requiring complete alertness or coordination until you feel fully awake in about 24-48 hours. Do not drink alcoholic beverages for at least 24 hours. Do not stay alone, you must have a responsible adult available to be with you. You may also experience a dry mouth or nausea for 24 hours. This is a normal side effect and will disappear as the effects of the medication wear off.    REMEMBER   If you experience any difficulty breathing, chest pain, bleeding that you feel is excessive, persistent nausea or vomiting or for any other concerns:  Call your  physician Dr.  Duard Getting, MD   at office . You may also ask to have the general doctor on call paged. They are available to you 24 hours a day.        FOLLOW-UP APPOINTMENTS   Please call your surgeon's office at the number listed to schedule a date / time of return for follow-up.     You can take a shower in 2-3 days.      If there is a white tape over your incisions you can leave it to you come back to the office in the next 5-7 days.  If there is any redness around the tape drainage or any smell take the tape off do not replace it and cleaned the area with hydrogen peroxide or Betadine.      Wear your binder or bra or both all the time unless you are taking a shower.    Incentive spirometry the nurses will teach him how to use it make sure you use it to have a big deep breaths.      Ankle pumps move your feet all the time even when laying in bed to avoid any clots in your legs and lungs.      Call the office at [phone] for an appointment next week.      Call my cell number at [phone]  if you need any help while after office hours or if you need it.      Avoid constipation by following the following advised ambulate and walk as much as you can, avoid taking heavy meals until you feel your intestines and bowels are moving, avoid taking pain medications mainly the narcotics on a regular time and try to take the least possible, you may take 2 extra-strength Tylenol 500 mg each 4 to 5 times a day and take the narcotics for breakthrough pain.

## 2024-05-09 NOTE — H&P (Signed)
 History and Physical      Patient Name: Lindsay Bautista  Age: 23 y.o.  Sex: female  DOB: [birth year]  MRN: [id number]  Date: 05/03/2024     Planned Procedure:   Bilateral Breast Reduction  Dr. Marzetta Solar  05/09/2024    HISTORY OF PRESENT ILLNESS:    Lindsay Bautista is a 23 y.o. year old female who presents today for the above surgery.  The patient has been diagnosed with excessive volume of breasts, pendulous breasts, and thoracic back pain.  The patient states that her mother and maternal grandmother both had to get breast reduction surgeries due to the same conditions.  The patient reports deep grooves in her shoulder from her bra straps and constant mid back pain that worsens throughout the day.  She self-referred to Dr. Marzetta Solar and after thorough evaluation and discussion of treatment options the patient has opted to proceed with the above surgery today.  She is in agreement to the surgery and denies having any questions at this time.    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    History of anesthesia complications       FAINTS AT THE SIGHT OF NEEDLES    Pendulous breast      Thoracic back pain      Wears glasses       CONTACTS        Surgical History:  Past Surgical History:   Procedure Laterality Date    HX TONSILLECTOMY      HX WISDOM TEETH EXTRACTION          Family History:  Family Medical History:       Problem Relation (Age of Onset)    Cancer Maternal Grandmother    Hypertension (High Blood Pressure) Mother, Father    No Known Problems Brother    Sjogren's syndrome Mother    Stroke Mother, Maternal Grandfather              Social History:  No tobacco, rare alcohol, and no drug use    HOME MEDICATIONS:  Albuterol  HFA MDI PRN       ALLERGIES:    No known drug allergies    REVIEW OF SYSTEMS:    Anesthesia Evaluation      No history of anesthetic complications   Airway   Dental      Pulmonary    (+) asthma  (-) shortness of breath, recent URI, sleep  apnea, rhonchi, wheezes, rales  Cardiovascular   (-) valvular problems/murmurs, CAD, dysrhythmias, angina, DOE, murmur, friction rub    Neuro/Psych - negative ROS     GI/Hepatic/Renal    (-) GERD, liver disease, renal disease    Endo/Other    (-) diabetes mellitus, hypothyroidism, hyperthyroidism, blood dyscrasia  Abdominal     Abdomen: soft.  Bowel sounds: normal.                     Labs and Imaging:  No results found for this or any previous visit (from the past week).        PHYSICAL EXAMINATION:     Vital Signs:    Vitals:    05/03/24 1519 05/09/24 0500   BP:  100/68   Pulse:  70   Temp:  37 C (98.6 F)   SpO2:  100%   Weight: 63.5 kg (140 lb)    Height: 1.549 m (5' 1)  BMI: 26.45           Physical Exam  Vitals and nursing note reviewed.   Constitutional:       General: She is not in acute distress.     Appearance: Normal appearance. She is not ill-appearing, toxic-appearing or diaphoretic.   HENT:      Head: Normocephalic and atraumatic.      Nose: Nose normal.      Comments: Naris patent     Mouth/Throat:      Mouth: Mucous membranes are moist.      Pharynx: Oropharynx is clear. Uvula midline. No oropharyngeal exudate or posterior oropharyngeal erythema.   Eyes:      General: No scleral icterus.     Extraocular Movements: Extraocular movements intact.      Conjunctiva/sclera: Conjunctivae normal.      Pupils: Pupils are equal, round, and reactive to light.   Cardiovascular:      Rate and Rhythm: Normal rate and regular rhythm.      Pulses: Normal pulses.      Heart sounds: Normal heart sounds. No murmur heard.     No friction rub. No gallop.   Pulmonary:      Effort: Pulmonary effort is normal. No respiratory distress.      Breath sounds: No wheezing, rhonchi or rales.   Abdominal:      General: Bowel sounds are normal. There is no distension.      Palpations: Abdomen is soft.      Tenderness: There is no abdominal tenderness.   Musculoskeletal:         General: No deformity. Normal range of motion.       Cervical back: Normal range of motion and neck supple. No rigidity or tenderness.      Right lower leg: No edema.      Left lower leg: No edema.   Skin:     General: Skin is warm and dry.      Capillary Refill: Capillary refill takes less than 2 seconds.      Coloration: Skin is not jaundiced or pale.      Comments: Normal tugor   Neurological:      General: No focal deficit present.      Mental Status: She is alert and oriented to person, place, and time. Mental status is at baseline.      Cranial Nerves: No cranial nerve deficit.      Motor: No weakness.      Gait: Gait normal.   Psychiatric:         Mood and Affect: Mood normal.         Behavior: Behavior normal.         Thought Content: Thought content normal.         Judgment: Judgment normal.          ASSESSMENT AND PLAN:  Excessive volume of breasts     The patient is scheduled for a breast reduction with Dr. Marzetta Solar today May 09, 2024      Eston Hence, PA-C

## 2024-05-09 NOTE — OR Surgeon (Signed)
 Operative Note  Patient Name: Lindsay Bautista, Lindsay Bautista [facility] Number: [id number]  Date of Service: 05/09/2024   Date of Birth: 08/12/2001      Pre-Operative Diagnosis: Hypertrophy of breast [N62]     Post-Operative Diagnosis: Hypertrophy of breast [N62]    Procedure(s)/Description:  REDUCTION BREAST BILATERAL: 29562 (CPT)       Findings: Hypertrophy of breast [N62]     Attending Surgeon: Duard Getting, MD     Anesthesia:  Anesthesiologist: Clyda Dark, MD  CRNA: Birder Bugler, CRNA    Anesthesia Type: .General     Estimated Blood Loss:  150    Description: bilateral breast reduction  Patient was marked preoperatively in sitting and lying down position procedure was discussed with the patient in the office and in the preop area all consents were signed and all questions were answered by her family member was there with her during the marking and during this part.    patient was taken to the operating room after adequate general anesthesia was given area was prepped and draped in usual sterile fashion patient was put in sitting position and marks were checked again as far as distance from sternal notch and inframammary fold.  At this time decision was made to proceed with the slarger breast which is the right side.  The areola was cut at 45 mm nipple areola was kept at the central pedicle tissue was removed inferiorly and laterally and small amounts superiorly.  Hemostasis carefully controlled the breast was shaped   hemostasis carefully controlled irrigation was done . weight removed right breast see above.    Same done on the left side , total weight removed see above.    Closure was done using Vicryl Monocryl and nylon no drains were used some suction lipectomy was done as part of the breast reduction to reduce the volume of the breast laterally and inferiorly.  Nipple position was done after checking with the patient in sitting position in relation to the inframammary fold as well as in relation to  the sternal  notch.  nipples were sutured in layers,  tape was applied and wrapping and then bra.  No drains were used all counts were correct.   Fluids 2000  Weight left 613.7  Right: 726.7  No drains    Complications:  None      Condition: stable    Disposition: PACU - hemodynamically stable.    Duard Getting, MD

## 2024-05-09 NOTE — Anesthesia Preprocedure Evaluation (Addendum)
 ANESTHESIA PRE-OP EVALUATION  Planned Procedure: REDUCTION BREAST BILATERAL (Bilateral)  Review of Systems  Anesthesia Complications comment: Mother denies FH  S/P wisdom teeth  T & A ~ age 23    anesthesia history negative               Pulmonary   asthma and rescue inhaler (uses only when has bronchitis),  denies history of smoking, not a current smoker and denies history of vaping   Cardiovascular     Exercise Tolerance: > or = 4 METS        GI/Hepatic/Renal           Endo/Other    anemia (hx),      Neuro/Psych/MS        Cancer                        Physical Assessment      Airway       Mallampati: II    TM distance: >3 FB    Neck ROM: full  Mouth Opening: good.  No Facial hair          Dental                    Pulmonary    Breath sounds clear to auscultation       Cardiovascular    Rhythm: regular  Rate: Normal       Other findings  Lower retainer permanent            Plan  ASA 2     Planned anesthesia type: general     general anesthesia with endotracheal tube intubation      PONV Plan:  I plan to administer pharmcologic prophalaxis antiemetics  POV PLAN:   plan for postoperative opioid use            Intravenous induction     Anesthesia issues/risks discussed are: Dental Injuries, Eye /Visual Loss, Post-op Intubation/Ventilation, Post-op Pain Management, Post-op Agitation/Tantrum, Stroke, Aspiration, Difficult Airway, Nerve Injuries, PONV, Post-op Cognitive Dysfunction, Cardiac Events/MI, Intraoperative Awareness/ Recall, Blood Loss and Sore Throat.  Anesthetic plan and risks discussed with patient and mother  signed consent obtained          Patient's NPO status is appropriate for Anesthesia.           Plan discussed with CRNA.    (GETA discussed  Pt reports cannot take pills)          Urine Pregnancy Results: Negative (Lot # [id number] expires 08-23-2025)

## 2024-05-09 NOTE — Anesthesia Transfer of Care (Signed)
 ANESTHESIA TRANSFER OF CARE   [address] Lindsay Bautista is a 23 y.o. ,female, Weight: 63.5 kg (140 lb)   had Procedure(s) with comments:  REDUCTION BREAST BILATERAL - LIPO MACHINE,PATEINT BRING GARMENTS,SUPINE  performed  05/09/24   Primary Service: Duard Getting, MD    Past Medical History:   Diagnosis Date   . 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 History of anesthesia complications     FAINTS AT THE SIGHT OF NEEDLES   . Pendulous breast    . Thoracic back pain    . Wears glasses     CONTACTS      Allergy History as of 05/09/24        No Known Allergies                  I completed my transfer of care / handoff to the receiving personnel during which we discussed:  Access, Airway, All key/critical aspects of case discussed, Analgesia, Antibiotics, Expectation of post procedure, Fluids/Product, Gave opportunity for questions and acknowledgement of understanding, Labs and PMHx  Report given to: Rogelia Clarks, RN                                                               Last OR Temp: Temperature: 36.7 C (98 F)  ABG:   Airway:* No LDAs found *  Blood pressure (!) 110/59, pulse 72, temperature 36.7 C (98 F), resp. rate 14, height 1.549 m (5' 1), weight 63.5 kg (140 lb), last menstrual period 04/05/2024, SpO2 100%.

## 2024-05-09 NOTE — OR First Assist (Signed)
 [facility]DS SURGERY    Patient Name: Lindsay Bautista  DOB: Feb 17, 2001  MRN: [id number]    Date of Service: 05/09/2024      Audelia Leaks, PA-C, assisted Dr. Marzetta Solar with the procedure bilateral breast reduction.    See the Operative Report by the surgeon.     [place], [place]  05/09/24 11:15

## 2024-12-12 ENCOUNTER — Ambulatory Visit (INDEPENDENT_AMBULATORY_CARE_PROVIDER_SITE_OTHER): Payer: Self-pay | Admitting: Internal Medicine
